# Patient Record
Sex: Male | Born: 1975 | Race: White | Hispanic: No | Marital: Married | Smoking: Current every day smoker
Health system: Southern US, Community
[De-identification: ages and names within clinical notes are randomized; demographics above are authoritative.]

## PROBLEM LIST (undated history)

## (undated) ENCOUNTER — Encounter

## (undated) ENCOUNTER — Telehealth

## (undated) ENCOUNTER — Encounter
Attending: Pharmacist Clinician (PhC)/ Clinical Pharmacy Specialist | Primary: Pharmacist Clinician (PhC)/ Clinical Pharmacy Specialist

## (undated) ENCOUNTER — Encounter: Attending: Family | Primary: Family

## (undated) ENCOUNTER — Ambulatory Visit: Payer: PRIVATE HEALTH INSURANCE | Attending: Family | Primary: Family

## (undated) ENCOUNTER — Encounter: Attending: Cardiovascular Disease | Primary: Cardiovascular Disease

## (undated) ENCOUNTER — Ambulatory Visit

## (undated) ENCOUNTER — Telehealth
Attending: Pharmacist Clinician (PhC)/ Clinical Pharmacy Specialist | Primary: Pharmacist Clinician (PhC)/ Clinical Pharmacy Specialist

## (undated) ENCOUNTER — Ambulatory Visit: Payer: PRIVATE HEALTH INSURANCE

## (undated) ENCOUNTER — Encounter: Payer: PRIVATE HEALTH INSURANCE | Attending: Family | Primary: Family

## (undated) DIAGNOSIS — I509 Heart failure, unspecified: Secondary | ICD-10-CM

## (undated) HISTORY — DX: Heart failure, unspecified: I50.9

## (undated) MED ORDER — MULTIVITAMIN TABLET: Freq: Every day | ORAL | 0.00000 days

## (undated) MED ORDER — ASPIRIN 81 MG TABLET,DELAYED RELEASE: Freq: Every day | ORAL | 0.00000 days

---
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
## 2004-07-29 ENCOUNTER — Emergency Department: Payer: Self-pay | Admitting: Emergency Medicine

## 2005-07-10 ENCOUNTER — Emergency Department: Payer: Self-pay | Admitting: Emergency Medicine

## 2006-08-12 ENCOUNTER — Emergency Department: Payer: Self-pay | Admitting: Emergency Medicine

## 2007-11-15 ENCOUNTER — Emergency Department: Payer: Self-pay | Admitting: Emergency Medicine

## 2009-07-11 ENCOUNTER — Emergency Department: Payer: Self-pay | Admitting: Emergency Medicine

## 2009-07-20 ENCOUNTER — Emergency Department: Payer: Self-pay | Admitting: Emergency Medicine

## 2010-02-09 ENCOUNTER — Emergency Department: Payer: Self-pay | Admitting: Unknown Physician Specialty
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
## 2011-11-22 ENCOUNTER — Emergency Department: Payer: Self-pay | Admitting: Emergency Medicine

## 2011-11-22 LAB — URINALYSIS, COMPLETE
Bilirubin,UR: NEGATIVE
Glucose,UR: NEGATIVE mg/dL (ref 0–75)
Nitrite: NEGATIVE
Ph: 6 (ref 4.5–8.0)
RBC,UR: <1 /HPF (ref 0–5)
Specific Gravity: 1.014 (ref 1.003–1.030)
Squamous Epithelial: NONE SEEN

## 2017-03-15 ENCOUNTER — Emergency Department
Admission: EM | Admit: 2017-03-15 | Discharge: 2017-03-15 | Disposition: A | Discharge status: Home / Self Care | Payer: Self-pay | Attending: Emergency Medicine | Admitting: Emergency Medicine

## 2017-03-15 DIAGNOSIS — F1910 Other psychoactive substance abuse, uncomplicated: Secondary | ICD-10-CM | POA: Insufficient documentation

## 2017-03-15 LAB — COMPREHENSIVE METABOLIC PANEL
ALK PHOS: 75 U/L (ref 38–126)
ALT: 109 U/L — ABNORMAL HIGH (ref 17–63)
ANION GAP: 9 (ref 5–15)
AST: 61 U/L — ABNORMAL HIGH (ref 15–41)
Albumin: 4.3 g/dL (ref 3.5–5.0)
BILIRUBIN TOTAL: 0.8 mg/dL (ref 0.3–1.2)
BUN: 9 mg/dL (ref 6–20)
CALCIUM: 9.5 mg/dL (ref 8.9–10.3)
CO2: 26 mmol/L (ref 22–32)
Chloride: 104 mmol/L (ref 101–111)
Creatinine, Ser: 1.19 mg/dL (ref 0.61–1.24)
Glucose, Bld: 111 mg/dL — ABNORMAL HIGH (ref 65–99)
Potassium: 3.8 mmol/L (ref 3.5–5.1)
SODIUM: 139 mmol/L (ref 135–145)
TOTAL PROTEIN: 7.8 g/dL (ref 6.5–8.1)

## 2017-03-15 LAB — CBC WITH DIFFERENTIAL/PLATELET
BASOS ABS: 0 10*3/uL (ref 0–0.1)
BASOS PCT: 0 %
Eosinophils Absolute: 0 10*3/uL (ref 0–0.7)
Eosinophils Relative: 0 %
HCT: 45 % (ref 40.0–52.0)
Hemoglobin: 15.2 g/dL (ref 13.0–18.0)
Lymphocytes Relative: 12 %
Lymphs Abs: 1.2 10*3/uL (ref 1.0–3.6)
MCH: 32.5 pg (ref 26.0–34.0)
MCHC: 33.7 g/dL (ref 32.0–36.0)
MCV: 96.2 fL (ref 80.0–100.0)
MONO ABS: 1.1 10*3/uL — AB (ref 0.2–1.0)
Monocytes Relative: 11 %
NEUTROS PCT: 77 %
Neutro Abs: 7.6 10*3/uL — ABNORMAL HIGH (ref 1.4–6.5)
Platelets: 211 10*3/uL (ref 150–440)
RBC: 4.67 MIL/uL (ref 4.40–5.90)
RDW: 12.6 % (ref 11.5–14.5)
WBC: 10 10*3/uL (ref 3.8–10.6)

## 2017-03-15 LAB — URINALYSIS, COMPLETE (UACMP) WITH MICROSCOPIC
Bacteria, UA: NONE SEEN
Bilirubin Urine: NEGATIVE
GLUCOSE, UA: NEGATIVE mg/dL
HGB URINE DIPSTICK: NEGATIVE
Ketones, ur: NEGATIVE mg/dL
LEUKOCYTES UA: NEGATIVE
NITRITE: NEGATIVE
Protein, ur: 100 mg/dL — AB
SPECIFIC GRAVITY, URINE: 1.02 (ref 1.005–1.030)
pH: 5 (ref 5.0–8.0)

## 2017-03-15 LAB — URINE DRUG SCREEN, QUALITATIVE (ARMC ONLY)
AMPHETAMINES, UR SCREEN: POSITIVE — AB
Barbiturates, Ur Screen: NOT DETECTED — AB
Benzodiazepine, Ur Scrn: POSITIVE — AB
CANNABINOID 50 NG, UR ~~LOC~~: POSITIVE — AB
COCAINE METABOLITE, UR ~~LOC~~: POSITIVE — AB
MDMA (ECSTASY) UR SCREEN: NOT DETECTED — AB
Methadone Scn, Ur: NOT DETECTED — AB
Opiate, Ur Screen: NOT DETECTED — AB
PHENCYCLIDINE (PCP) UR S: NOT DETECTED — AB
TRICYCLIC, UR SCREEN: NOT DETECTED — AB

## 2017-03-15 LAB — ETHANOL

## 2017-03-15 NOTE — ED Notes (Signed)
BEHAVIORAL HEALTH ROUNDING Patient sleeping: No. Patient alert and oriented: yes Behavior appropriate: Yes.  ; If no, describe:  Nutrition and fluids offered: yes Toileting and hygiene offered: Yes  Sitter present: q15 minute observations and security  monitoring Law enforcement present: Yes  ODS  

## 2017-03-15 NOTE — ED Notes (Signed)

## 2017-03-15 NOTE — Discharge Instructions (Signed)
You were sent in for psychiatric evaluation for substance abuse, and are being referred for outpatient resources.  Return to the emergency department immediately for any confusion altered mental status, chest pain, trouble breathing, fever, overdose, weakness, numbness, or any other symptoms concerning to you.

## 2017-03-15 NOTE — ED Provider Notes (Signed)
[facility] Emergency Department Provider Note ____________________________________________   I have reviewed the triage vital signs and the triage nursing note.  HISTORY  Chief Complaint Psychiatric Evaluation   Historian Patient  HPI Luke Jackson is a 41 y.o. male was brought in under police custody with involuntary commitment by officer out of concern for substance abuse heroin overdose.    Apparently friend of the patient called due to patient being unresponsive. There was concern about possible heroin overdose. No reported use of Narcan.  Patient himself states that he has not been sleeping very well and just was very tired. He states that he was "playing. "And was not unresponsive.  Patient does report substance abuse. Denies suicidal ideation or homicidal ideation. Patient states "I love me. "  Currently no symptoms. He has eaten here in the emergency department.    No past medical history on file.  There are no active problems to display for this patient.   No past surgical history on file.  Prior to Admission medications   Not on File    Allergies not on file  No family history on file.  Social History Social History  Substance Use Topics  . Smoking status: Not on file  . Smokeless tobacco: Not on file  . Alcohol use Not on file    Review of Systems  Constitutional: Negative for fever. Eyes: Negative for visual changes. ENT: Negative for sore throat. Cardiovascular: Negative for chest pain. Respiratory: Negative for shortness of breath. Gastrointestinal: Negative for abdominal pain, vomiting and diarrhea. Genitourinary: Negative for dysuria. Musculoskeletal: Negative for back pain. Skin: Negative for rash. Neurological: Negative for headache.  ____________________________________________   PHYSICAL EXAM:  VITAL SIGNS: ED Triage Vitals  Enc Vitals Group     BP      Pulse      Resp      Temp      Temp src    SpO2      Weight      Height      Head Circumference      Peak Flow      Pain Score      Pain Loc      Pain Edu?      Excl. in GC?      Constitutional: Alert and oriented. Well appearing and in no distress. HEENT   Head: Normocephalic and atraumatic.      Eyes: Conjunctivae are normal. Pupils equal and round.       Ears:         Nose: No congestion/rhinnorhea.   Mouth/Throat: Mucous membranes are moist.   Neck: No stridor. Cardiovascular/Chest: Normal rate, regular rhythm.  No murmurs, rubs, or gallops. Respiratory: Normal respiratory effort without tachypnea nor retractions. Breath sounds are clear and equal bilaterally. No wheezes/rales/rhonchi. Gastrointestinal: Soft. No distention, no guarding, no rebound. Nontender.    Genitourinary/rectal:Deferred Musculoskeletal: Nontender with normal range of motion in all extremities. No joint effusions.  No lower extremity tenderness.  No edema. Neurologic:  Normal speech and language. No gross or focal neurologic deficits are appreciated. Skin:  Skin is warm, dry and intact. No rash noted. Psychiatric: Mood and affect are normal. Speech and behavior are normal. Patient exhibits appropriate insight and judgment.   ____________________________________________  LABS (pertinent positives/negatives)  Labs Reviewed - No data to display  ____________________________________________    EKG I, Governor Rooksebecca Karcyn Menn, MD, the attending physician have personally viewed and interpreted all ECGs.  None ____________________________________________  RADIOLOGY All Xrays  were viewed by me. Imaging interpreted by Radiologist.  None __________________________________________  PROCEDURES  Procedure(s) performed: None  Critical Care performed: None  ____________________________________________   ED COURSE / ASSESSMENT AND PLAN  Pertinent labs & imaging results that were available during my care of the patient were reviewed by me  and considered in my medical decision making (see chart for details).   He lives with his girlfriend. I was able to speak with his girlfriend in person who understands the situation and is not concerned about him being a danger to himself. She states he started a new job soon.  Patient was brought in with involuntary commitment. I released him from the initial IVC.  I initially had consulted TTS to help with collateral information, but I was able to speak with the girlfriend with the patient myself.  Involuntary commitment paperwork and indicated patient had elevated blood pressure, I don't have documentation of this, he indicates his blood pressure here is normal. He is asymptomatic.    CONSULTATIONS:   None   Patient / Family / Caregiver informed of clinical course, medical decision-making process, and agree with plan.   I discussed return precautions, follow-up instructions, and discharge instructions with patient and/or family.  Discharge Instructions : You were sent in for psychiatric evaluation for substance abuse, and are being referred for outpatient resources.  Return to the emergency department immediately for any confusion altered mental status, chest pain, trouble breathing, fever, overdose, weakness, numbness, or any other symptoms concerning to you.  ___________________________________________   FINAL CLINICAL IMPRESSION(S) / ED DIAGNOSES   Final diagnoses:  Substance abuse              Note: This dictation was prepared with Dragon dictation. Any transcriptional errors that result from this process are unintentional    Governor Rooks, MD 03/15/17 1840

## 2017-06-01 ENCOUNTER — Ambulatory Visit: Admission: RE | Admit: 2017-06-01 | Discharge: 2017-06-01 | Disposition: A | Discharge status: Home / Self Care

## 2017-06-01 DIAGNOSIS — I5042 Chronic combined systolic (congestive) and diastolic (congestive) heart failure: Secondary | ICD-10-CM

## 2017-06-01 DIAGNOSIS — R0602 Shortness of breath: Principal | ICD-10-CM

## 2017-06-01 DIAGNOSIS — R768 Other specified abnormal immunological findings in serum: Secondary | ICD-10-CM

## 2017-06-01 DIAGNOSIS — I10 Essential (primary) hypertension: Secondary | ICD-10-CM

## 2017-06-01 MED ORDER — CARVEDILOL 12.5 MG TABLET: each | 6 refills | 0 days

## 2017-06-01 MED ORDER — ATORVASTATIN 80 MG TABLET
ORAL_TABLET | Freq: Every evening | ORAL | 2 refills | 0.00000 days | Status: CP
Start: 2017-06-01 — End: 2017-06-01

## 2017-06-01 MED ORDER — CARVEDILOL 12.5 MG TABLET
ORAL_TABLET | Freq: Two times a day (BID) | ORAL | 6 refills | 0.00000 days | Status: CP
Start: 2017-06-01 — End: 2017-06-01

## 2017-06-01 MED ORDER — CARVEDILOL 12.5 MG TABLET: 13 mg | tablet | Freq: Two times a day (BID) | 6 refills | 0 days | Status: AC

## 2017-06-01 MED ORDER — FUROSEMIDE 20 MG TABLET
ORAL_TABLET | Freq: Every day | ORAL | 2 refills | 0.00000 days | Status: CP
Start: 2017-06-01 — End: 2017-08-30

## 2017-06-01 MED ORDER — ATORVASTATIN 80 MG TABLET: 80 mg | tablet | 2 refills | 0 days

## 2017-06-01 MED ORDER — ATORVASTATIN 80 MG TABLET: 80 mg | tablet | Freq: Every evening | 2 refills | 0 days | Status: AC

## 2017-06-01 MED ORDER — FUROSEMIDE 20 MG TABLET: 20 mg | tablet | Freq: Every day | 2 refills | 0 days | Status: AC

## 2017-06-14 MED ORDER — LISINOPRIL 10 MG TABLET
ORAL_TABLET | Freq: Every day | ORAL | 5 refills | 0.00000 days | Status: CP
Start: 2017-06-14 — End: 2019-02-14

## 2017-06-22 ENCOUNTER — Ambulatory Visit: Admission: RE | Admit: 2017-06-22 | Discharge: 2017-06-22 | Disposition: A | Discharge status: Home / Self Care

## 2017-06-22 DIAGNOSIS — I10 Essential (primary) hypertension: Secondary | ICD-10-CM

## 2017-06-22 DIAGNOSIS — I5042 Chronic combined systolic (congestive) and diastolic (congestive) heart failure: Principal | ICD-10-CM

## 2017-06-22 DIAGNOSIS — R768 Other specified abnormal immunological findings in serum: Secondary | ICD-10-CM

## 2017-06-22 MED ORDER — CARVEDILOL 25 MG TABLET: 25 mg | tablet | Freq: Two times a day (BID) | 5 refills | 0 days | Status: AC

## 2017-06-22 MED ORDER — CARVEDILOL 25 MG TABLET
ORAL_TABLET | Freq: Two times a day (BID) | ORAL | 5 refills | 0.00000 days | Status: CP
Start: 2017-06-22 — End: 2017-06-22

## 2017-08-30 MED ORDER — FUROSEMIDE 20 MG TABLET
ORAL_TABLET | Freq: Every day | ORAL | 2 refills | 0 days | Status: CP
Start: 2017-08-30 — End: 2019-02-14

## 2018-02-07 ENCOUNTER — Encounter: Admit: 2018-02-07 | Discharge: 2018-02-08 | Payer: PRIVATE HEALTH INSURANCE

## 2018-02-07 DIAGNOSIS — I5042 Chronic combined systolic (congestive) and diastolic (congestive) heart failure: Principal | ICD-10-CM

## 2018-02-07 DIAGNOSIS — I10 Essential (primary) hypertension: Secondary | ICD-10-CM

## 2018-05-24 ENCOUNTER — Ambulatory Visit: Admit: 2018-05-24 | Discharge: 2018-05-24 | Payer: BLUE CROSS/BLUE SHIELD

## 2018-05-24 ENCOUNTER — Encounter: Admit: 2018-05-24 | Discharge: 2018-05-24 | Payer: BLUE CROSS/BLUE SHIELD

## 2018-05-24 DIAGNOSIS — I5042 Chronic combined systolic (congestive) and diastolic (congestive) heart failure: Principal | ICD-10-CM

## 2018-05-24 DIAGNOSIS — R768 Other specified abnormal immunological findings in serum: Principal | ICD-10-CM

## 2018-05-24 MED ORDER — CARVEDILOL 12.5 MG TABLET
ORAL_TABLET | Freq: Two times a day (BID) | ORAL | 6 refills | 0.00000 days | Status: CP
Start: 2018-05-24 — End: ?

## 2019-02-14 ENCOUNTER — Ambulatory Visit: Admit: 2019-02-14 | Discharge: 2019-02-15 | Payer: BLUE CROSS/BLUE SHIELD

## 2019-02-14 DIAGNOSIS — I10 Essential (primary) hypertension: Secondary | ICD-10-CM

## 2019-02-14 DIAGNOSIS — I5042 Chronic combined systolic (congestive) and diastolic (congestive) heart failure: Principal | ICD-10-CM

## 2019-02-14 DIAGNOSIS — R768 Other specified abnormal immunological findings in serum: Secondary | ICD-10-CM

## 2019-02-14 MED ORDER — FUROSEMIDE 20 MG TABLET
ORAL_TABLET | Freq: Every day | ORAL | 2 refills | 0 days | PRN
Start: 2019-02-14 — End: ?

## 2019-02-14 MED ORDER — OMEPRAZOLE 20 MG CAPSULE,DELAYED RELEASE
ORAL_CAPSULE | Freq: Every day | ORAL | 3 refills | 0 days
Start: 2019-02-14 — End: 2020-02-14

## 2019-05-03 DIAGNOSIS — R768 Other specified abnormal immunological findings in serum: Secondary | ICD-10-CM

## 2019-05-03 MED ORDER — LISINOPRIL 5 MG TABLET
ORAL_TABLET | Freq: Three times a day (TID) | ORAL | 3 refills | 90 days | Status: CP
Start: 2019-05-03 — End: ?

## 2019-05-03 MED ORDER — OMEPRAZOLE 20 MG CAPSULE,DELAYED RELEASE
ORAL_CAPSULE | Freq: Every day | ORAL | 3 refills | 90.00000 days | Status: CP
Start: 2019-05-03 — End: ?

## 2019-05-03 MED ORDER — FUROSEMIDE 20 MG TABLET
ORAL_TABLET | Freq: Every day | ORAL | 3 refills | 90.00000 days | Status: CP | PRN
Start: 2019-05-03 — End: ?

## 2019-05-03 MED ORDER — CARVEDILOL 12.5 MG TABLET: tablet | 3 refills | 0 days | Status: AC

## 2019-05-03 MED ORDER — CARVEDILOL 12.5 MG TABLET
ORAL_TABLET | Freq: Two times a day (BID) | ORAL | 3 refills | 0.00000 days | Status: CP
Start: 2019-05-03 — End: 2019-05-03

## 2019-05-08 ENCOUNTER — Encounter
Admit: 2019-05-08 | Discharge: 2019-05-09 | Payer: BLUE CROSS/BLUE SHIELD | Attending: Gastroenterology | Primary: Gastroenterology

## 2019-05-08 DIAGNOSIS — B182 Chronic viral hepatitis C: Secondary | ICD-10-CM

## 2019-05-13 DIAGNOSIS — B182 Chronic viral hepatitis C: Secondary | ICD-10-CM

## 2020-03-04 MED ORDER — FUROSEMIDE 20 MG TABLET
ORAL_TABLET | Freq: Every day | ORAL | 3 refills | 90.00000 days | Status: CP | PRN
Start: 2020-03-04 — End: ?

## 2020-05-13 MED ORDER — LISINOPRIL 5 MG TABLET
ORAL_TABLET | ORAL | 0 refills | 0.00000 days | Status: CP
Start: 2020-05-13 — End: ?

## 2020-06-01 MED ORDER — CARVEDILOL 12.5 MG TABLET
ORAL_TABLET | Freq: Two times a day (BID) | ORAL | 0 refills | 90 days | Status: CP
Start: 2020-06-01 — End: ?

## 2020-06-01 MED ORDER — LISINOPRIL 5 MG TABLET
ORAL_TABLET | Freq: Every day | ORAL | 0 refills | 30.00000 days | Status: CP
Start: 2020-06-01 — End: ?

## 2020-06-22 ENCOUNTER — Encounter: Admit: 2020-06-22 | Discharge: 2020-06-22 | Payer: PRIVATE HEALTH INSURANCE

## 2020-06-22 DIAGNOSIS — I5042 Chronic combined systolic (congestive) and diastolic (congestive) heart failure: Principal | ICD-10-CM

## 2020-06-22 DIAGNOSIS — I10 Essential (primary) hypertension: Principal | ICD-10-CM

## 2020-06-22 DIAGNOSIS — I110 Hypertensive heart disease with heart failure: Principal | ICD-10-CM

## 2020-06-22 DIAGNOSIS — R768 Other specified abnormal immunological findings in serum: Principal | ICD-10-CM

## 2020-07-07 MED ORDER — LISINOPRIL 5 MG TABLET
ORAL_TABLET | Freq: Every day | ORAL | 3 refills | 90 days | Status: CP
Start: 2020-07-07 — End: ?

## 2020-08-13 ENCOUNTER — Ambulatory Visit: Admit: 2020-08-13 | Discharge: 2020-08-14 | Payer: PRIVATE HEALTH INSURANCE | Attending: Family | Primary: Family

## 2020-08-13 DIAGNOSIS — Z6826 Body mass index (BMI) 26.0-26.9, adult: Principal | ICD-10-CM

## 2020-08-13 DIAGNOSIS — B1920 Unspecified viral hepatitis C without hepatic coma: Principal | ICD-10-CM

## 2020-08-13 DIAGNOSIS — K7400 Hepatic fibrosis, unspecified: Principal | ICD-10-CM

## 2020-08-31 ENCOUNTER — Ambulatory Visit: Admit: 2020-08-31 | Discharge: 2020-09-01 | Payer: PRIVATE HEALTH INSURANCE

## 2020-08-31 DIAGNOSIS — K74 Fibrosis of liver: Principal | ICD-10-CM

## 2020-08-31 DIAGNOSIS — B1920 Unspecified viral hepatitis C without hepatic coma: Principal | ICD-10-CM

## 2020-08-31 DIAGNOSIS — N281 Cyst of kidney, acquired: Principal | ICD-10-CM

## 2020-09-01 DIAGNOSIS — B182 Chronic viral hepatitis C: Principal | ICD-10-CM

## 2020-09-01 MED ORDER — GLECAPREVIR 100 MG-PIBRENTASVIR 40 MG TABLET
Freq: Every day | ORAL | 1 refills | 28 days | Status: CP
Start: 2020-09-01 — End: ?
  Filled 2020-09-10: qty 1, 28d supply, fill #0

## 2020-09-07 MED ORDER — CARVEDILOL 12.5 MG TABLET
ORAL_TABLET | ORAL | 0 refills | 0.00000 days | Status: CP
Start: 2020-09-07 — End: ?

## 2020-09-08 NOTE — Unmapped (Signed)
[facility] Specialty Medication Onboarding    Specialty Medication: Mavyret 100-40 mg tablet  Prior Authorization: Approved   Financial Assistance: Yes - copay card approved as secondary   Final Copay/Day Supply: $5 / 28 day supply    Insurance Restrictions: None     Notes to Pharmacist:     The triage team has completed the benefits investigation and has determined that the patient is able to fill this medication at [facility]. Please contact the patient to complete the onboarding or follow up with the prescribing physician as needed.

## 2020-09-08 NOTE — Unmapped (Signed)
[facility]   Patient Onboarding/Medication Counseling    Maurice Parks is a 45 y.o. male with Hepatitis C who I am counseling today on initiation of therapy.  I am speaking to the patient.    Was a Nurse, learning disability used for this call? No    Verified patient's date of birth / HIPAA.    Specialty medication(s) to be sent: Infectious Disease: Mavyret      Non-specialty medications/supplies to be sent: n/a      Medications not needed at this time: n/a         Mavyret (glecaprevir and pibrentasvir) 100mg /40mg     Planned Start Date: 09/10/20    ??? Has the patient been told to call and make a 4 week follow-up appointment after their medicine has been received?   o Yes, he was told to call  [phone], option 1, option 1 and schedule the appointment with Owens Shark, DNP.      Medication & Administration     Dosage: Take three tablets by mouth once daily for 8 weeks.    Administration: Take with food.    Adherence/Missed dose instructions:   ??? Take missed dose with food as soon as you think about it.   ??? If it has been more than 18 hours since the missed dose, skip the missed dose and resume with your next scheduled dose.  ??? Do not take extra doses or 2 doses at the same time.    Goals of Therapy     The goal of therapy is to cure the patient of Hepatitis C. Patients who have undetectable HCV RNA in the serum, as assessed by a sensitive polymerase chain reaction (PCR) assay, ?12 weeks after treatment completion are deemed to have achieved SVR (cure). (www.hcvguidelines.org)     Side Effects & Monitoring Parameters     Common Side Effects:  ??? Headache  ??? Fatigue   ??? Nausea    The following side effects should be reported to the provider:  ??? Signs of liver problems like dark urine, feeling tired, lack of hunger, upset stomach or stomach pain, light-colored stools, throwing up, or yellow skin/eyes.  ??? Signs of an allergic reaction, such as rash; hives; itching; red, swollen, blistered, or peeling skin with or without fever.   ??? If you have wheezing; tightness in the chest or throat; trouble breathing, swallowing, or talking; unusual hoarseness; or swelling of the mouth, face, lips, tongue, or throat, call 911 or go to the closest emergency department (ED).     Monitoring Parameters: (AASLD-IDSA Guidelines)    Within 6 months prior to starting treatment:  ??? Complete blood count (CBC).  ??? International normalized ratio (INR) if clinically indicated.   ??? Hepatic function panel: albumin, total and direct bilirubin, ALT, AST, and Alkaline Phosphatase levels.  ??? Calculated glomerular filtration rate (eGFR).  ??? Pregnancy testing within 1 month prior to starting treatment in females of childbearing age  Any time prior to starting treatment:  ??? Test for HCV genotype.  ??? Assess for hepatic fibrosis.  ??? Quantitative HCV RNA (HCV viral load).  ??? Assess for active HBV coinfection: HBV surface antigen (HBsAg); If HBsAg positive, then should be assessed for whether HBV DNA level meets AASLD criteria for HBV treatment.  ??? Assess for evidence of prior HBV infection and immunity: HBV core antibody (anti-HBc) and HBV surface antibody (anti-HBS).   ??? Assess for HIV coinfection.    Monitoring during treatment:   ??? Diabetics should monitor  for signs of hypoglycemia.  ??? Patients on warfarin should monitor for changes in INR.  ??? Pregnancy test monthly in females of childbearing age.  ??? For HBsAg positive patients not already on HBV therapy because baseline HBV DNA level does not meet treatment criteria:   o Initiate prophylactic HBV antiviral therapy OR  o Monitor HBV DNA levels monthly during and immediately after Mavyret therapy.    After treatment to document a cure:   ??? Quantitative HCV viral load 12 weeks after completion of therapy.    Contraindications, Warnings, & Precautions     Contraindications: Moderate or severe hepatic impairment (Child-Pugh class B or C); history of hepatic decompensation; coadministration with atazanavir or rifampin.    Disease-related concerns:   o Hepatitis B virus reactivation.   o Risk of hepatic decompensation/failure in patients with evidence of advanced liver disease.    Drug/Food Interactions     ??? Medication list reviewed in Epic. The patient was instructed to inform the care team before taking any new medications or supplements. Drug interaction as follows: .   o Carvedilol: Mavyret can increase carvedilol levels.  I told him to closely  monitor for reduced BP and HR and watch for symptoms of low BP such as dizziness or lightheadedness.  ??? Encourage minimizing alcohol intake: Denies alcohol    Storage, Handling Precautions, & Disposal     ??? Store in the original container at room temperature.   ??? Store in a dry place. Do not store in a bathroom.   ??? Keep all drugs in a safe place. Keep all drugs out of the reach of children and pets.   ??? Disposal: You should NOT have any Mavyret to throw away.      Current Medications (including OTC/herbals), Comorbidities and Allergies     Current Outpatient Medications   Medication Sig Dispense Refill   ??? aspirin (ECOTRIN) 81 MG tablet Take 81 mg by mouth daily.     ??? aspirin 81 MG chewable tablet Chew 1 tablet (81 mg total) daily. 30 tablet 2   ??? carvediloL (COREG) 12.5 MG tablet TAKE 1 TABLET BY MOUTH TWICE DAILY WITH MEALS 180 tablet 0   ??? cholecalciferol, vitamin D3, (VITAMIN D3) 1,000 unit capsule Take 3,000 Units by mouth daily.     ??? furosemide (LASIX) 20 MG tablet Take 1 tablet (20 mg total) by mouth daily as needed for swelling. 90 tablet 3   ??? glecaprevir-pibrentasvir (MAVYRET) 100-40 mg tablet Take 3 tablets by mouth daily. Take with food. 1 4-week carton 1   ??? lisinopriL (PRINIVIL,ZESTRIL) 5 MG tablet Take 3 tablets (15 mg total) by mouth daily. (Patient taking differently: Take 15 mg by mouth daily. Takes 5 mg in the morning and 10 mg at night) 270 tablet 3   ??? multivitamin (TAB-A-VITE/THERAGRAN) per tablet Take 1 tablet by mouth daily.     ??? omeprazole (PRILOSEC) 20 MG capsule Take 1 capsule (20 mg total) by mouth daily. (Patient taking differently: Take 20 mg by mouth nightly. ) 90 capsule 3     No current facility-administered medications for this visit.       No Known Allergies    Patient Active Problem List   Diagnosis   ??? Anxiety   ??? Hypertension   ??? Tobacco abuse   ??? RUQ pain   ??? Polysubstance abuse (CMS-HCC)   ??? Dyspnea on exertion   ??? Chest pain on breathing   ??? Acute combined systolic and diastolic heart failure (  CMS-HCC)   ??? QT prolongation   ??? Hepatitis C antibody positive in blood   ??? 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    Appropriateness of Therapy     Is medication and dose appropriate based on diagnosis? Yes    Prescription has been clinically reviewed: Yes    Baseline Quality of Life Assessment      How many days over the past month did your Hepatitis C  keep you from your normal activities? For example, brushing your teeth or getting up in the morning. 0    Financial Information     Medication Assistance provided: Prior Authorization and Copay Assistance    Anticipated copay of $5.00 reviewed with patient. Verified delivery address.    Delivery Information      Scheduled delivery date: 09/10/20    Expected start date: 09/10/20    Medication will be delivered via UPS to the prescription address in [facility].  This shipment will not require a signature.      Explained the services we provide at [facility] and that each month we would call to set up refills.  Stressed importance of returning phone calls so that we could ensure they receive their medications in time each month.  Informed patient that we should be setting up refills 7-10 days prior to when they will run out of medication.  A pharmacist will reach out to perform a clinical assessment periodically.  Informed patient that a welcome packet and a drug information handout will be sent. Patient verbalized understanding of the above information as well as how to contact the pharmacy at [phone] option 4 with any questions/concerns.  The pharmacy is open Monday through Friday 8:30am-4:30pm.  A pharmacist is available 24/7 via pager to answer any clinical questions they may have.    Patient Specific Needs     - Does the patient have any physical, cognitive, or cultural barriers? No    - Patient prefers to have medications discussed with  Patient     - Is the patient or caregiver able to read and understand education materials at a high school level or above? Yes    - Patient's primary language is  English     - Is the patient high risk? No    - Does the patient require a Care Management Plan? No     - Does the patient require physician intervention or other additional services (i.e. nutrition, smoking cessation, social work)? No      Roderic Palau  [facility] Specialty Pharmacist

## 2020-09-09 NOTE — Unmapped (Signed)
Maurice Parks 's Mavyret shipment will be delayed as a result of the patient's insurance plan being in grace period (patient needs to pay insurance premium).     I have reached out to the patient and communicated the delay. We will wait for a call back from the patient to reschedule the delivery.  We have not confirmed the new delivery date.

## 2020-09-10 NOTE — Unmapped (Signed)
Maurice Parks 's Mavyret shipment will be sent out  as a result of the patient is no longer in their grace period.     I have reached out to the patient and communicated the delivery change. We will reschedule the medication for the delivery date that the patient agreed upon.  We have confirmed the delivery date as 09/11/2020, via ups.

## 2020-09-29 NOTE — Unmapped (Signed)
[facility] Clinical Assessment & Refill Coordination Note    Maurice Parks, [place]: 29-Sep-1975  Phone: [phone] (home)     All above HIPAA information was verified with patient.     Was a Nurse, learning disability used for this call? No    Specialty Medication(s):   Infectious Disease: Mavyret     Current Outpatient Medications   Medication Sig Dispense Refill   ??? aspirin (ECOTRIN) 81 MG tablet Take 81 mg by mouth daily.     ??? aspirin 81 MG chewable tablet Chew 1 tablet (81 mg total) daily. 30 tablet 2   ??? carvediloL (COREG) 12.5 MG tablet TAKE 1 TABLET BY MOUTH TWICE DAILY WITH MEALS 180 tablet 0   ??? cholecalciferol, vitamin D3, (VITAMIN D3) 1,000 unit capsule Take 3,000 Units by mouth daily.     ??? furosemide (LASIX) 20 MG tablet Take 1 tablet (20 mg total) by mouth daily as needed for swelling. 90 tablet 3   ??? glecaprevir-pibrentasvir (MAVYRET) 100-40 mg tablet Take 3 tablets by mouth daily. Take with food. 1 4-week carton 1   ??? lisinopriL (PRINIVIL,ZESTRIL) 5 MG tablet Take 3 tablets (15 mg total) by mouth daily. (Patient taking differently: Take 15 mg by mouth daily. Takes 5 mg in the morning and 10 mg at night) 270 tablet 3   ??? multivitamin (TAB-A-VITE/THERAGRAN) per tablet Take 1 tablet by mouth daily.     ??? omeprazole (PRILOSEC) 20 MG capsule Take 1 capsule (20 mg total) by mouth daily. (Patient taking differently: Take 20 mg by mouth nightly. ) 90 capsule 3     No current facility-administered medications for this visit.        Changes to medications: [name] reports no changes at this time.    No Known Allergies    Changes to allergies: No    SPECIALTY MEDICATION ADHERENCE     Mavyret   : 10 days of medicine on hand       Medication Adherence    Patient reported X missed doses in the last month: 0  Specialty Medication: Mavyret  Patient is on additional specialty medications: No  Demonstrates understanding of importance of adherence: yes  Informant: patient  Provider-estimated medication adherence level: good  Patient is at risk for Non-Adherence: No          Specialty medication(s) dose(s) confirmed: Regimen is correct and unchanged.     Are there any concerns with adherence? No    Adherence counseling provided? Not needed    CLINICAL MANAGEMENT AND INTERVENTION      Clinical Benefit Assessment:    Do you feel the medicine is effective or helping your condition? Yes    Clinical Benefit counseling provided? Not needed    Adverse Effects Assessment:    Are you experiencing any side effects? No    Are you experiencing difficulty administering your medicine? No    Quality of Life Assessment:    How many days over the past month did your Hepatitis C  keep you from your normal activities? For example, brushing your teeth or getting up in the morning. 0    Have you discussed this with your provider? Not needed    Therapy Appropriateness:    Is therapy appropriate? Yes, therapy is appropriate and should be continued    DISEASE/MEDICATION-SPECIFIC INFORMATION      For Hepatitis C patients (clinical assessment):  Regimen: Mavyret x 8 weeks  Therapy start date: 09/11/20  Completed Treatment Week #: 2  What time of day do you take your medicine? 9:30 pm with a bowl of Frosted Mini-Wheats  Unable to perform a pill count over the phone.  He states he has not missed any doses so his quantity on hand should be 10 days including the dose for today. If correct, then the quantity on hand is appropriate.    Are you taking any new OTC or herbal medication? No  Any alcoholic beverages? No  Do you have a follow up appointment? Yes, appointment is scheduled, patient is aware, and no identified barriers    PATIENT SPECIFIC NEEDS     - Does the patient have any physical, cognitive, or cultural barriers? No    - Is the patient high risk? No    - Does the patient require a Care Management Plan? No     - Does the patient require physician intervention or other additional services (i.e. nutrition, smoking cessation, social work)? No      SHIPPING     Specialty Medication(s) to be Shipped:   Infectious Disease: Mavyret    Other medication(s) to be shipped: No additional medications requested for fill at this time     Changes to insurance: No    Delivery Scheduled: Yes, Expected medication delivery date: 10/02/20.     Medication will be delivered via UPS to the confirmed prescription address in [facility].    The patient will receive a drug information handout for each medication shipped and additional FDA Medication Guides as required.  Verified that patient has previously received a Conservation officer, historic buildings.    All of the patient's questions and concerns have been addressed.    Roderic Palau   [facility] Specialty Pharmacist

## 2020-09-29 NOTE — Unmapped (Signed)
This patient has been disenrolled from the [facility] specialty pharmacy services due to therapy completion - expected therapy completion date: 11/06/20. His last shipment of Mavyret will be delivered on 10/02/20. Marland Kitchen    Roderic Palau  [facility] Specialty Pharmacist

## 2020-10-01 MED FILL — MAVYRET 100 MG-40 MG TABLET: ORAL | 28 days supply | Qty: 1 | Fill #1

## 2020-10-15 ENCOUNTER — Encounter: Admit: 2020-10-15 | Discharge: 2020-10-16 | Payer: PRIVATE HEALTH INSURANCE | Attending: Family | Primary: Family

## 2020-10-15 DIAGNOSIS — B182 Chronic viral hepatitis C: Principal | ICD-10-CM

## 2020-12-21 MED ORDER — CARVEDILOL 12.5 MG TABLET
ORAL_TABLET | 0 refills | 0 days | Status: CP
Start: 2020-12-21 — End: ?

## 2021-01-28 ENCOUNTER — Encounter: Admit: 2021-01-28 | Discharge: 2021-01-29 | Payer: PRIVATE HEALTH INSURANCE | Attending: Family | Primary: Family

## 2021-01-28 DIAGNOSIS — K7402 Advanced hepatic fibrosis: Principal | ICD-10-CM

## 2021-01-28 DIAGNOSIS — B182 Chronic viral hepatitis C: Principal | ICD-10-CM

## 2021-03-29 MED ORDER — CARVEDILOL 12.5 MG TABLET
ORAL_TABLET | Freq: Two times a day (BID) | ORAL | 1 refills | 90 days | Status: CP
Start: 2021-03-29 — End: ?

## 2021-04-27 ENCOUNTER — Ambulatory Visit: Admit: 2021-04-27 | Discharge: 2021-04-28 | Payer: PRIVATE HEALTH INSURANCE

## 2021-06-24 ENCOUNTER — Ambulatory Visit: Admit: 2021-06-24 | Discharge: 2021-06-25 | Payer: PRIVATE HEALTH INSURANCE

## 2021-06-24 DIAGNOSIS — I5042 Chronic combined systolic (congestive) and diastolic (congestive) heart failure: Principal | ICD-10-CM

## 2021-06-24 DIAGNOSIS — I10 Essential (primary) hypertension: Principal | ICD-10-CM

## 2021-06-24 MED ORDER — LISINOPRIL 5 MG TABLET
ORAL_TABLET | Freq: Every day | ORAL | 3 refills | 90 days | Status: CP
Start: 2021-06-24 — End: ?

## 2021-06-28 MED ORDER — ATORVASTATIN 40 MG TABLET
ORAL_TABLET | Freq: Every day | ORAL | 3 refills | 90 days | Status: CP
Start: 2021-06-28 — End: ?

## 2021-07-19 MED ORDER — LISINOPRIL 5 MG TABLET
ORAL_TABLET | Freq: Every day | ORAL | 3 refills | 90 days
Start: 2021-07-19 — End: ?

## 2021-07-20 MED ORDER — LISINOPRIL 5 MG TABLET
ORAL_TABLET | Freq: Every day | ORAL | 3 refills | 90.00000 days | Status: CP
Start: 2021-07-20 — End: ?

## 2021-07-29 ENCOUNTER — Ambulatory Visit: Admit: 2021-07-29 | Payer: PRIVATE HEALTH INSURANCE

## 2021-10-12 MED ORDER — CARVEDILOL 12.5 MG TABLET
ORAL_TABLET | Freq: Two times a day (BID) | ORAL | 1 refills | 90 days | Status: CP
Start: 2021-10-12 — End: ?

## 2022-02-25 ENCOUNTER — Encounter: Payer: Self-pay | Admitting: *Deleted

## 2022-02-25 ENCOUNTER — Other Ambulatory Visit: Payer: Self-pay

## 2022-02-25 ENCOUNTER — Emergency Department: Payer: Self-pay

## 2022-02-25 ENCOUNTER — Emergency Department
Admission: EM | Admit: 2022-02-25 | Discharge: 2022-02-25 | Disposition: A | Discharge status: Home / Self Care | Payer: Self-pay | Attending: Emergency Medicine | Admitting: Emergency Medicine

## 2022-02-25 DIAGNOSIS — I110 Hypertensive heart disease with heart failure: Secondary | ICD-10-CM | POA: Insufficient documentation

## 2022-02-25 DIAGNOSIS — Z20822 Contact with and (suspected) exposure to covid-19: Secondary | ICD-10-CM | POA: Insufficient documentation

## 2022-02-25 DIAGNOSIS — I509 Heart failure, unspecified: Secondary | ICD-10-CM | POA: Insufficient documentation

## 2022-02-25 DIAGNOSIS — Z79899 Other long term (current) drug therapy: Secondary | ICD-10-CM | POA: Insufficient documentation

## 2022-02-25 DIAGNOSIS — R0602 Shortness of breath: Secondary | ICD-10-CM | POA: Insufficient documentation

## 2022-02-25 LAB — BASIC METABOLIC PANEL
Anion gap: 10 (ref 5–15)
BUN: 18 mg/dL (ref 6–20)
CO2: 25 mmol/L (ref 22–32)
Calcium: 9.5 mg/dL (ref 8.9–10.3)
Chloride: 101 mmol/L (ref 98–111)
Creatinine, Ser: 0.78 mg/dL (ref 0.61–1.24)
GFR, Estimated: >60 mL/min (ref >60)
Glucose, Bld: 102 mg/dL — ABNORMAL HIGH (ref 70–99)
Potassium: 4.2 mmol/L (ref 3.5–5.1)
Sodium: 136 mmol/L (ref 135–145)

## 2022-02-25 LAB — RESP PANEL BY RT-PCR (FLU A&B, COVID) ARPGX2
Influenza A by PCR: NEGATIVE
Influenza B by PCR: NEGATIVE
SARS Coronavirus 2 by RT PCR: NEGATIVE

## 2022-02-25 LAB — CBC
HCT: 38.2 % — ABNORMAL LOW (ref 39.0–52.0)
Hemoglobin: 12.7 g/dL — ABNORMAL LOW (ref 13.0–17.0)
MCH: 30.7 pg (ref 26.0–34.0)
MCHC: 33.2 g/dL (ref 30.0–36.0)
MCV: 92.3 fL (ref 80.0–100.0)
Platelets: 219 10*3/uL (ref 150–400)
RBC: 4.14 MIL/uL — ABNORMAL LOW (ref 4.22–5.81)
RDW: 12.5 % (ref 11.5–15.5)
WBC: 7.7 10*3/uL (ref 4.0–10.5)
nRBC: 0 % (ref 0.0–0.2)

## 2022-02-25 LAB — BRAIN NATRIURETIC PEPTIDE: B Natriuretic Peptide: 30.1 pg/mL (ref 0.0–100.0)

## 2022-02-25 LAB — TROPONIN I (HIGH SENSITIVITY): Troponin I (High Sensitivity): 6 ng/L (ref <18)

## 2022-02-25 MED ORDER — FUROSEMIDE 10 MG/ML IJ SOLN
40.0000 mg | Freq: Once | INTRAMUSCULAR | Status: AC
  Administered 2022-02-25: 40 mg via INTRAVENOUS
  Filled 2022-02-25: qty 4

## 2022-02-25 NOTE — Discharge Instructions (Addendum)
Continue taking your normal medications as prescribed.  Follow-up with your cardiologist as scheduled.  Return to the ER for new, worsening, or persistent severe shortness of breath, cough, chest pain, weakness or lightheadedness, or any other new or worsening symptoms that concern you.

## 2022-02-25 NOTE — ED Provider Notes (Signed)
[facility] Provider Note    Event Date/Time   First MD Initiated Contact with Patient 02/25/22 1033     (approximate)   History   Shortness of Breath   HPI  Luke Jackson is a 46 y.o. male with a history of CHF and hypertension who presents with shortness of breath, acute onset early this morning, persistent course, not associated with cough, fever, chest pain, or other acute symptoms.  He denies any new leg swelling.  He states he is compliant with his medications.  He is not regularly on Lasix but does use it occasionally as needed.      Physical Exam   Triage Vital Signs: ED Triage Vitals  Enc Vitals Group     BP 02/25/22 1008 138/85     Pulse Rate 02/25/22 1008 65     Resp 02/25/22 1008 18     Temp 02/25/22 1008 97.9 F (36.6 C)     Temp Source 02/25/22 1008 Oral     SpO2 02/25/22 1008 98 %     Weight 02/25/22 1009 185 lb (83.9 kg)     Height 02/25/22 1009 5\' 11"  (1.803 m)     Head Circumference --      Peak Flow --      Pain Score 02/25/22 1009 0     Pain Loc --      Pain Edu? --      Excl. in GC? --     Most recent vital signs: Vitals:   02/25/22 1130 02/25/22 1230  BP: 115/82 124/85  Pulse: (!) 52 (!) 49  Resp: 15 13  Temp:    SpO2: 98% 98%     General: Awake, no distress.  CV:  Good peripheral perfusion.  Normal heart sounds. Resp:  Normal effort.  Mild rales to bilateral lung bases. Abd:  No distention.  Other:  No peripheral edema.   ED Results / Procedures / Treatments   Labs (all labs ordered are listed, but only abnormal results are displayed) Labs Reviewed  BASIC METABOLIC PANEL - Abnormal; Notable for the following components:      Result Value   Glucose, Bld 102 (*)    All other components within normal limits  CBC - Abnormal; Notable for the following components:   RBC 4.14 (*)    Hemoglobin 12.7 (*)    HCT 38.2 (*)    All other components within normal limits  RESP PANEL BY RT-PCR (FLU A&B, COVID)  ARPGX2  BRAIN NATRIURETIC PEPTIDE  TROPONIN I (HIGH SENSITIVITY)     EKG  ED ECG REPORT I, 02/27/22, the attending physician, personally viewed and interpreted this ECG.  Date: 02/25/2022 EKG Time: 1013 Rate: 61 Rhythm: normal sinus rhythm QRS Axis: normal Intervals: normal ST/T Wave abnormalities: normal Narrative Interpretation: no evidence of acute ischemia    RADIOLOGY  Chest x-ray: I independently viewed and interpreted the images; there is no focal consolidation or edema  PROCEDURES:  Critical Care performed: No  Procedures   MEDICATIONS ORDERED IN ED: Medications  furosemide (LASIX) injection 40 mg (40 mg Intravenous Given 02/25/22 1142)     IMPRESSION / MDM / ASSESSMENT AND PLAN / ED COURSE  I reviewed the triage vital signs and the nursing notes.  46 year old male with PMH as noted above presents with acute onset of shortness of breath today.  I reviewed the past medical records.  The patient follows with cardiology at [facility] and was last seen there on  06/24/2021.  He has CHF with an EF around 20%.  He is not regularly on diuresis.  Differential diagnosis includes, but is not limited to, CHF exacerbation, pneumonia, COVID-19 or other viral etiology, bronchitis, reactive airways.  Chest x-ray does not show frank edema although the patient does have mild rales on exam.  I suspect CHF as the most likely etiology.  We will give a dose of IV Lasix, obtain BNP and troponin, respiratory panel, and reassess.  Patient's presentation is most consistent with acute presentation with potential threat to life or bodily function.  ----------------------------------------- 1:28 PM on 02/25/2022 -----------------------------------------  Troponin is negative.  Given the lack of chest pain or EKG changes and the time since onset of the symptoms there is no indication for a repeat.  BNP is negative.  Respiratory panel is negative.  The patient is feeling well.  He  is stable for discharge home at this time.  Return precautions given, and he expresses understanding.   FINAL CLINICAL IMPRESSION(S) / ED DIAGNOSES   Final diagnoses:  SOB (shortness of breath)     Rx / DC Orders   ED Discharge Orders     None        Note:  This document was prepared using Dragon voice recognition software and may include unintentional dictation errors.    Dionne Bucy, MD 02/25/22 1329

## 2022-02-25 NOTE — ED Triage Notes (Signed)
Patient c/o new onset of shortness of breath that began today. Patient has a history of congested heart failure. Patient denies chest pain.

## 2022-03-15 MED ORDER — FUROSEMIDE 20 MG TABLET
ORAL_TABLET | Freq: Every day | ORAL | 3 refills | 90.00000 days | Status: CN | PRN
Start: 2022-03-15 — End: ?

## 2022-03-28 MED ORDER — CARVEDILOL 12.5 MG TABLET
ORAL_TABLET | 0 refills | 0 days | Status: CP
Start: 2022-03-28 — End: ?

## 2022-04-26 MED ORDER — CARVEDILOL 12.5 MG TABLET
ORAL_TABLET | Freq: Two times a day (BID) | ORAL | 3 refills | 90 days | Status: CP
Start: 2022-04-26 — End: ?

## 2022-06-03 MED ORDER — FUROSEMIDE 20 MG TABLET
ORAL_TABLET | 0 refills | 0 days
Start: 2022-06-03 — End: ?

## 2022-06-04 MED ORDER — FUROSEMIDE 20 MG TABLET
ORAL_TABLET | 0 refills | 0 days | Status: CP
Start: 2022-06-04 — End: ?

## 2022-06-27 ENCOUNTER — Ambulatory Visit: Admit: 2022-06-27 | Discharge: 2022-06-28

## 2022-06-27 DIAGNOSIS — I10 Essential (primary) hypertension: Principal | ICD-10-CM

## 2022-06-27 DIAGNOSIS — B1920 Unspecified viral hepatitis C without hepatic coma: Principal | ICD-10-CM

## 2022-06-27 DIAGNOSIS — I5042 Chronic combined systolic (congestive) and diastolic (congestive) heart failure: Principal | ICD-10-CM

## 2022-06-27 MED ORDER — ROSUVASTATIN 10 MG TABLET
ORAL_TABLET | Freq: Every day | ORAL | 3 refills | 90 days | Status: CP
Start: 2022-06-27 — End: ?

## 2022-10-09 MED ORDER — LISINOPRIL 5 MG TABLET
ORAL_TABLET | Freq: Every day | ORAL | 0 refills | 0 days
Start: 2022-10-09 — End: ?

## 2022-10-10 MED ORDER — LISINOPRIL 5 MG TABLET
ORAL_TABLET | Freq: Every day | ORAL | 0 refills | 90 days | Status: CP
Start: 2022-10-10 — End: ?

## 2022-10-26 MED ORDER — VARENICLINE 0.5 MG (11)-1 MG (42) TABLETS IN A DOSE PACK
0 refills | 0 days | Status: CP
Start: 2022-10-26 — End: 2023-01-24

## 2022-12-01 MED ORDER — VARENICLINE 1 MG TABLET
ORAL_TABLET | Freq: Two times a day (BID) | ORAL | 2 refills | 30 days | Status: CP
Start: 2022-12-01 — End: 2023-03-01

## 2023-01-15 MED ORDER — LISINOPRIL 5 MG TABLET
ORAL_TABLET | Freq: Every day | ORAL | 0 refills | 90 days
Start: 2023-01-15 — End: ?

## 2023-01-16 MED ORDER — LISINOPRIL 5 MG TABLET
ORAL_TABLET | Freq: Every day | ORAL | 0 refills | 90.00000 days | Status: CP
Start: 2023-01-16 — End: 2023-01-16

## 2023-03-08 MED ORDER — VARENICLINE 1 MG TABLET
ORAL_TABLET | ORAL | 0 refills | 0 days | Status: CP
Start: 2023-03-08 — End: ?

## 2023-04-19 MED ORDER — CARVEDILOL 12.5 MG TABLET
ORAL_TABLET | ORAL | 0 refills | 0 days | Status: CP
Start: 2023-04-19 — End: ?

## 2023-04-24 MED ORDER — FUROSEMIDE 20 MG TABLET
ORAL_TABLET | Freq: Every day | ORAL | 1 refills | 90 days | Status: CP | PRN
Start: 2023-04-24 — End: ?

## 2023-06-28 ENCOUNTER — Ambulatory Visit: Admit: 2023-06-28 | Discharge: 2023-06-28 | Payer: BLUE CROSS/BLUE SHIELD

## 2023-06-28 DIAGNOSIS — I10 Essential (primary) hypertension: Principal | ICD-10-CM

## 2023-06-28 DIAGNOSIS — I5042 Chronic combined systolic (congestive) and diastolic (congestive) heart failure: Principal | ICD-10-CM

## 2023-06-28 MED ORDER — LISINOPRIL 5 MG TABLET
ORAL_TABLET | Freq: Every day | ORAL | 3 refills | 90 days | Status: CP
Start: 2023-06-28 — End: ?

## 2023-06-28 MED ORDER — ROSUVASTATIN 10 MG TABLET
ORAL_TABLET | Freq: Every day | ORAL | 3 refills | 90 days | Status: CP
Start: 2023-06-28 — End: ?

## 2023-06-28 MED ORDER — CARVEDILOL 12.5 MG TABLET
ORAL_TABLET | Freq: Two times a day (BID) | ORAL | 3 refills | 90 days | Status: CP
Start: 2023-06-28 — End: ?

## 2024-03-18 MED ORDER — OMEPRAZOLE 20 MG CAPSULE,DELAYED RELEASE
ORAL_CAPSULE | Freq: Every day | ORAL | 3 refills | 90.00000 days | Status: CP
Start: 2024-03-18 — End: ?

## 2024-03-18 MED ORDER — VARENICLINE TARTRATE 1 MG TABLET
ORAL_TABLET | Freq: Every day | ORAL | 0 refills | 60.00000 days | Status: CP
Start: 2024-03-18 — End: ?

## 2024-03-18 MED ORDER — ROSUVASTATIN 10 MG TABLET
ORAL_TABLET | Freq: Every day | ORAL | 3 refills | 90.00000 days | Status: CP
Start: 2024-03-18 — End: ?

## 2024-03-18 MED ORDER — LISINOPRIL 5 MG TABLET
ORAL_TABLET | Freq: Every day | ORAL | 3 refills | 90.00000 days | Status: CP
Start: 2024-03-18 — End: ?

## 2024-03-18 MED ORDER — CARVEDILOL 12.5 MG TABLET
ORAL_TABLET | Freq: Two times a day (BID) | ORAL | 3 refills | 90.00000 days | Status: CP
Start: 2024-03-18 — End: ?

## 2024-03-18 MED ORDER — FUROSEMIDE 20 MG TABLET
ORAL_TABLET | Freq: Every day | ORAL | 1 refills | 90.00000 days | Status: CP | PRN
Start: 2024-03-18 — End: ?
